# Patient Record
Sex: Male | Born: 1970 | Race: White | Hispanic: No | Marital: Married | Smoking: Never smoker
Health system: Southern US, Community
[De-identification: ages and names within clinical notes are randomized; demographics above are authoritative.]

## PROBLEM LIST (undated history)

## (undated) DIAGNOSIS — I77810 Thoracic aortic ectasia: Secondary | ICD-10-CM

## (undated) DIAGNOSIS — F419 Anxiety disorder, unspecified: Secondary | ICD-10-CM

## (undated) HISTORY — DX: Thoracic aortic ectasia: I77.810

## (undated) HISTORY — DX: Anxiety disorder, unspecified: F41.9

---
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
## 2001-06-28 ENCOUNTER — Ambulatory Visit (HOSPITAL_COMMUNITY): Admission: RE | Admit: 2001-06-28 | Discharge: 2001-06-28 | Payer: Self-pay | Admitting: Family Medicine

## 2001-06-28 ENCOUNTER — Encounter: Payer: Self-pay | Admitting: Family Medicine

## 2003-04-10 ENCOUNTER — Emergency Department (HOSPITAL_COMMUNITY): Admission: EM | Admit: 2003-04-10 | Discharge: 2003-04-11 | Payer: Self-pay

## 2004-07-07 ENCOUNTER — Emergency Department (HOSPITAL_COMMUNITY): Admission: EM | Admit: 2004-07-07 | Discharge: 2004-07-07 | Payer: Self-pay | Admitting: Emergency Medicine

## 2004-08-14 ENCOUNTER — Ambulatory Visit: Admission: RE | Admit: 2004-08-14 | Discharge: 2004-08-14 | Payer: Self-pay | Admitting: *Deleted

## 2004-08-14 ENCOUNTER — Encounter (INDEPENDENT_AMBULATORY_CARE_PROVIDER_SITE_OTHER): Payer: Self-pay | Admitting: *Deleted

## 2004-11-11 ENCOUNTER — Emergency Department (HOSPITAL_COMMUNITY): Admission: EM | Admit: 2004-11-11 | Discharge: 2004-11-11 | Payer: Self-pay | Admitting: Emergency Medicine

## 2004-11-14 ENCOUNTER — Ambulatory Visit: Payer: Self-pay | Admitting: Gastroenterology

## 2004-11-19 ENCOUNTER — Ambulatory Visit: Payer: Self-pay | Admitting: Gastroenterology

## 2004-11-21 ENCOUNTER — Ambulatory Visit: Payer: Self-pay | Admitting: Gastroenterology

## 2004-11-21 DIAGNOSIS — K209 Esophagitis, unspecified without bleeding: Secondary | ICD-10-CM | POA: Insufficient documentation

## 2004-11-21 DIAGNOSIS — K449 Diaphragmatic hernia without obstruction or gangrene: Secondary | ICD-10-CM | POA: Insufficient documentation

## 2004-11-21 DIAGNOSIS — K219 Gastro-esophageal reflux disease without esophagitis: Secondary | ICD-10-CM | POA: Insufficient documentation

## 2004-12-18 ENCOUNTER — Ambulatory Visit: Payer: Self-pay | Admitting: Gastroenterology

## 2004-12-30 ENCOUNTER — Emergency Department (HOSPITAL_COMMUNITY): Admission: EM | Admit: 2004-12-30 | Discharge: 2004-12-30 | Payer: Self-pay | Admitting: Emergency Medicine

## 2005-01-01 ENCOUNTER — Emergency Department (HOSPITAL_COMMUNITY): Admission: EM | Admit: 2005-01-01 | Discharge: 2005-01-01 | Payer: Self-pay | Admitting: Emergency Medicine

## 2005-03-09 ENCOUNTER — Emergency Department (HOSPITAL_COMMUNITY): Admission: EM | Admit: 2005-03-09 | Discharge: 2005-03-09 | Payer: Self-pay | Admitting: Emergency Medicine

## 2005-04-03 ENCOUNTER — Ambulatory Visit: Payer: Self-pay | Admitting: Gastroenterology

## 2005-05-05 ENCOUNTER — Ambulatory Visit: Payer: Self-pay | Admitting: Gastroenterology

## 2005-07-27 ENCOUNTER — Ambulatory Visit: Payer: Self-pay | Admitting: Gastroenterology

## 2005-08-04 ENCOUNTER — Ambulatory Visit: Payer: Self-pay | Admitting: Gastroenterology

## 2006-08-03 ENCOUNTER — Ambulatory Visit: Payer: Self-pay | Admitting: Gastroenterology

## 2006-08-03 LAB — CONVERTED CEMR LAB
ALT: 17 units/L (ref 0–53)
AST: 23 units/L (ref 0–37)
Albumin: 4.1 g/dL (ref 3.5–5.2)
Alkaline Phosphatase: 38 units/L — ABNORMAL LOW (ref 39–117)
BUN: 19 mg/dL (ref 6–23)
Basophils Absolute: 0 10*3/uL (ref 0.0–0.1)
Basophils Relative: 0.1 % (ref 0.0–1.0)
Bilirubin, Direct: 0.2 mg/dL (ref 0.0–0.3)
CO2: 30 meq/L (ref 19–32)
Calcium: 9.6 mg/dL (ref 8.4–10.5)
Chloride: 100 meq/L (ref 96–112)
Creatinine, Ser: 1 mg/dL (ref 0.4–1.5)
Eosinophils Absolute: 0.1 10*3/uL (ref 0.0–0.6)
Eosinophils Relative: 1.9 % (ref 0.0–5.0)
Folate: 16.5 ng/mL
GFR calc Af Amer: 109 mL/min
GFR calc non Af Amer: 90 mL/min
Glucose, Bld: 102 mg/dL — ABNORMAL HIGH (ref 70–99)
HCT: 43.7 % (ref 39.0–52.0)
Hemoglobin: 15.5 g/dL (ref 13.0–17.0)
Lymphocytes Relative: 22.3 % (ref 12.0–46.0)
MCHC: 35.5 g/dL (ref 30.0–36.0)
MCV: 88.8 fL (ref 78.0–100.0)
Monocytes Absolute: 0.3 10*3/uL (ref 0.2–0.7)
Monocytes Relative: 6.3 % (ref 3.0–11.0)
Neutro Abs: 3.6 10*3/uL (ref 1.4–7.7)
Neutrophils Relative %: 69.4 % (ref 43.0–77.0)
Platelets: 210 10*3/uL (ref 150–400)
Potassium: 3.5 meq/L (ref 3.5–5.1)
RBC: 4.92 M/uL (ref 4.22–5.81)
RDW: 11.8 % (ref 11.5–14.6)
Sed Rate: 5 mm/hr (ref 0–20)
Sodium: 134 meq/L — ABNORMAL LOW (ref 135–145)
TSH: 0.93 microintl units/mL (ref 0.35–5.50)
Total Bilirubin: 1.3 mg/dL — ABNORMAL HIGH (ref 0.3–1.2)
Total Protein: 7.1 g/dL (ref 6.0–8.3)
Vitamin B-12: 636 pg/mL (ref 211–911)
WBC: 5.2 10*3/uL (ref 4.5–10.5)

## 2006-11-24 ENCOUNTER — Ambulatory Visit: Payer: Self-pay | Admitting: Gastroenterology

## 2007-03-03 DIAGNOSIS — F411 Generalized anxiety disorder: Secondary | ICD-10-CM | POA: Insufficient documentation

## 2007-03-03 DIAGNOSIS — F410 Panic disorder [episodic paroxysmal anxiety] without agoraphobia: Secondary | ICD-10-CM

## 2007-03-03 DIAGNOSIS — K589 Irritable bowel syndrome without diarrhea: Secondary | ICD-10-CM

## 2009-11-06 ENCOUNTER — Emergency Department (HOSPITAL_COMMUNITY): Admission: EM | Admit: 2009-11-06 | Discharge: 2009-11-06 | Payer: Self-pay | Admitting: Emergency Medicine

## 2010-05-27 NOTE — Assessment & Plan Note (Signed)
[facility]                         GASTROENTEROLOGY OFFICE NOTE   NAME:[name], Tyrone                       MRN:          [id number]  DATE:11/24/2006                            DOB:          Mar 31, 1970    Tyrone [name] was tapered off of Klonopin but has had a relapse of his chronic  anxiety syndrome.  Once this occurs he has shortness of breath,  palpitations, and irritable bowel syndrome-type problems.  He has not  abused his medications, is well aware of possible addiction abuse but  certainly this does not appear to be a factor in his management.  I have  asked him to speak to Dr. Dellia Cloud about his chronic anxiety syndrome.  I have gone ahead and re-upped his Klonopin to 1 mg twice a day with  followup in 1 month's time.  As per my previous notes, this management  of his chronic anxiety syndrome seems to manage most of his medical  problems.     Vania Rea. Jarold Motto, MD, Caleen Essex, FAGA  Electronically Signed    DRP/MedQ  DD: 11/24/2006  DT: 11/25/2006  Job #: [id number]   cc:   Onalee Hua L. Dellia Cloud, PhD

## 2010-05-27 NOTE — Assessment & Plan Note (Signed)
[facility]                         GASTROENTEROLOGY OFFICE NOTE   NAME:Tyrone [name], Saiki                       MRN:          [id number]  DATE:08/03/2006                            DOB:          12-16-1970    Red is doing well and has come off of his Zantac.  He never was  treated as previously recommended July of 2007 for Giardia because he  said it was too expensive.  In any case, he denies any GI complaints  whatsoever except for inability to gain weight.  He is having no  diarrhea, melena, hematochezia, or any upper or lower GI complaints.  He  has a history of IBS with a history of anxiety attacks.  He has been  taking Klonopin 1 mg twice a day for at least a year.  He continues to  work out and hike and is very active.  Previous evaluation for  malabsorption and celiac disease was negative.   He is a healthy-appearing white male in no distress, appearing his  stated age.  He weight 171 pounds, which is up 1 pound from a year ago.  Blood  pressure 112/48, the pulse was 80 and regular.  I could not appreciate stigmata of chronic liver disease.  His chest was clear and he was in a regular rhythm without murmurs,  gallops, or rubs.  His abdomen was entirely benign without organomegaly, masses,  tenderness, or distension.  Bowel sounds were normal.  RECTAL EXAM:  Deferred.  Peripheral extremities were unremarkable.  Mental status was clear.   ASSESSMENT:  Tyrone [name] is doing well and has irritable bowel syndrome and  history of panic attacks and chronic anxiety syndrome.  I sincerely  doubt that he has any chronic malabsorption state.   RECOMMENDATIONS:  1. Repeat screening lab parameters and repeat sprue antibodies.  2. We will taper him off of Klonopin at a rate of 0.5 mg per day every      2 weeks as tolerated.  3. Otherwise p.r.n. GI followup as needed.     Vania Rea. Jarold Motto, MD, Caleen Essex, FAGA  Electronically Signed    DRP/MedQ   DD: 08/03/2006  DT: 08/03/2006  Job #: [id number]

## 2010-05-27 NOTE — Assessment & Plan Note (Signed)
[facility]                                 ON-CALL NOTE   NAME:[name], [name]                       MRN:          [id number]  DATE:11/22/2006                            DOB:          1970/02/11    TIME:  12 p.m.   TELEPHONE:  [id number].   GASTROENTEROLOGIST:  Vania Rea. Jarold Motto, MD, Clementeen Graham, FACP, FAGA.   [name] called this evening wanting advice on resuming Klonopin.  He  states that Dr. Sheryn Bison started Klonopin previously and he has  tapered off the medication under Dr. Norval Gable supervision.  He has a  return in anxiety and he would like advice on restarting the medication.  His symptoms have been present for several days.  He has no acute  problem today but just would like long term advice on the use of this  medication.  I advised him I was not able to adequately address his  question since I am unsure of his full medical history and the reason  this medication was used initially and how it was tapered.  I advised  him to call Dr. Jarold Motto during our normal office hours, 8 a.m. to 5  p.m., tomorrow for further advice and he states he will do so.     Venita Lick. Russella Dar, MD, [facility]  Electronically Signed    MTS/MedQ  DD: 11/22/2006  DT: 11/22/2006  Job #: [id number]   cc:   Vania Rea. Jarold Motto, MD, Caleen Essex, FAGA

## 2010-05-30 NOTE — Assessment & Plan Note (Signed)
[name]                           GASTROENTEROLOGY OFFICE NOTE   NAME:PERKINSLeven, [name]                       MRN:          [id number]  DATE:08/04/2005                            DOB:          04-11-1970    HISTORY OF PRESENT ILLNESS:  Tyrone [name] is doing better on Klonopin at a low  dose and Zantac. He continues with gas, bloating, and bowel irregularity and  gives a very strong history of exposure to Giardia with back packing. He has  a friend, who was recently diagnosed as having Giardiasis. We did 2 stool  examinations on July 29, 2005 which were negative for Giardia, however.   PHYSICAL EXAMINATION:  VITAL SIGNS:  He continues to be unable to gain  weight despite the fact his weight today was up to 171 pounds from a low of  168. Blood pressure 118/84, and pulse was 72 and regular.  GENERAL:  Physical examination was not repeated.   ASSESSMENT/PLAN:  I suspect Tyrone [name] does have irritable bowel syndrome.  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 I  have empirically decided to put him on metronidazole 250 mg t.i.d. for 10  days, along with Align probiotic therapy. He is to call in 2 weeks time for  progress report. Previous celiac antibodies were negative but I cannot see  where he has had a small bowel biopsy.                                   Vania Rea. Jarold Motto, MD, Clementeen Graham, [place]   DRP/MedQ  DD:  08/04/2005  DT:  08/04/2005  Job #:  [id number]

## 2016-03-08 ENCOUNTER — Emergency Department (HOSPITAL_COMMUNITY)
Admission: EM | Admit: 2016-03-08 | Discharge: 2016-03-08 | Disposition: A | Discharge status: Home / Self Care | Payer: Self-pay | Attending: Emergency Medicine | Admitting: Emergency Medicine

## 2016-03-08 ENCOUNTER — Encounter (HOSPITAL_COMMUNITY): Payer: Self-pay | Admitting: Emergency Medicine

## 2016-03-08 DIAGNOSIS — K0889 Other specified disorders of teeth and supporting structures: Secondary | ICD-10-CM | POA: Insufficient documentation

## 2016-03-08 MED ORDER — AMOXICILLIN 500 MG PO CAPS: 500.0000 mg | ORAL_CAPSULE | Freq: Three times a day (TID) | ORAL | 0 refills | Status: DC

## 2016-03-08 MED ORDER — HYDROCODONE-ACETAMINOPHEN 5-325 MG PO TABS
2.0000 | ORAL_TABLET | ORAL | 0 refills | Status: DC | PRN
Start: 2016-03-08 — End: 2018-11-28

## 2016-03-08 NOTE — ED Notes (Signed)
Declined W/C at D/C and was escorted to lobby by RN. 

## 2016-03-08 NOTE — ED Provider Notes (Signed)
[facility]-EMERGENCY DEPT Provider Note   CSN: [id number] Arrival date & time: 03/08/16  0750     History   Chief Complaint Chief Complaint  Patient presents with  . Dental Pain    HPI Tyrone [name] is a 46 y.o. male.  The history is provided by the patient.  Dental Pain   This is a new problem. The current episode started more than 1 week ago. The problem occurs constantly. The problem has been gradually worsening. The pain is severe. He has tried nothing for the symptoms. The treatment provided no relief.  Pt complains of bad dental pain.   Pt reports he has not seen a dentist in a long time. Pt reports pain is severe.  Pt states he can not wait another day.  Pt reports [facility] and they agreed to see him if he came here.   History reviewed. No pertinent past medical history.  Patient Active Problem List   Diagnosis Date Noted  . ANXIETY, CHRONIC 03/03/2007  . PANIC ATTACK 03/03/2007  . IRRITABLE BOWEL SYNDROME 03/03/2007  . ESOPHAGITIS 11/21/2004  . ESOPHAGEAL REFLUX 11/21/2004  . HIATAL HERNIA 11/21/2004    History reviewed. No pertinent surgical history.     Home Medications    Prior to Admission medications   Medication Sig Start Date End Date Taking? Authorizing Provider  amoxicillin (AMOXIL) 500 MG capsule Take 1 capsule (500 mg total) by mouth 3 (three) times daily. 03/08/16   Elson AreasLeslie K Lopaka Karge, PA-C  HYDROcodone-acetaminophen (NORCO/VICODIN) 5-325 MG tablet Take 2 tablets by mouth every 4 (four) hours as needed. 03/08/16   Elson AreasLeslie K Kiaan Overholser, PA-C    Family History History reviewed. No pertinent family history.  Social History Social History  Substance Use Topics  . Smoking status: Never Smoker  . Smokeless tobacco: Never Used  . Alcohol use No     Allergies   Patient has no known allergies.   Review of Systems Review of Systems  All other systems reviewed and are negative.    Physical Exam Updated Vital Signs BP 140/87 (BP Location:  Left Arm)   Pulse 66   Temp 97.7 F (36.5 C) (Oral)   Resp 16   SpO2 100%   Physical Exam  Constitutional: He appears well-developed and well-nourished.  HENT:  Head: Normocephalic and atraumatic.  Mouth/Throat: Oropharynx is clear and moist.  Eyes: Conjunctivae are normal.  Neck: Neck supple.  Cardiovascular: Normal rate and regular rhythm.   No murmur heard. Pulmonary/Chest: Effort normal and breath sounds normal. No respiratory distress.  Abdominal: Soft. There is no tenderness.  Musculoskeletal: He exhibits no edema.  Neurological: He is alert.  Skin: Skin is warm and dry.  Psychiatric: He has a normal mood and affect.  Nursing note and vitals reviewed.  No obvious abscess, cavity in 2nd molar.  Pt requested I call [facility].  I attempted x 2.  They did not return my call.  I gave pt the number for Dr. Leanord Asalfarless and advised that he call tomorrow.   ED Treatments / Results  Labs (all labs ordered are listed, but only abnormal results are displayed) Labs Reviewed - No data to display  EKG  EKG Interpretation None       Radiology No results found.  Procedures Procedures (including critical care time)  Medications Ordered in ED Medications - No data to display   Initial Impression / Assessment and Plan / ED Course  I have reviewed the triage vital signs and the  nursing notes.  Pertinent labs & imaging results that were available during my care of the patient were reviewed by me and considered in my medical decision making (see chart for details).     Meds ordered this encounter  Medications  . amoxicillin (AMOXIL) 500 MG capsule    Sig: Take 1 capsule (500 mg total) by mouth 3 (three) times daily.    Dispense:  30 capsule    Refill:  0    Order Specific Question:   Supervising Provider    Answer:   MILLER, BRIAN [[id number]]  . HYDROcodone-acetaminophen (NORCO/VICODIN) 5-325 MG tablet    Sig: Take 2 tablets by mouth every 4 (four) hours as needed.     Dispense:  10 tablet    Refill:  0    Order Specific Question:   Supervising Provider    Answer:   Eber Hong [[id number]]  An After Visit Summary was printed and given to the patient.    Final Clinical Impressions(s) / ED Diagnoses   Final diagnoses:  Toothache    New Prescriptions Discharge Medication List as of 03/08/2016  8:56 AM    START taking these medications   Details  amoxicillin (AMOXIL) 500 MG capsule Take 1 capsule (500 mg total) by mouth 3 (three) times daily., Starting Sun 03/08/2016, Print    HYDROcodone-acetaminophen (NORCO/VICODIN) 5-325 MG tablet Take 2 tablets by mouth every 4 (four) hours as needed., Starting Sun 03/08/2016, Print         Lonia Skinner [place], PA-C 03/08/16 1049    Canary Brim Tegeler, MD 03/08/16 2049

## 2016-03-08 NOTE — ED Triage Notes (Signed)
Pt here with left sided dental pain 

## 2016-03-08 NOTE — ED Triage Notes (Signed)
PT reports dental pain going on for some time but would not be specific on start date. Pain was worse last night and Pt reports he could not sleep. Pt reports he has not been to a dentist but has been using listerine mouth wash for pain.

## 2017-10-28 ENCOUNTER — Other Ambulatory Visit: Payer: Self-pay | Admitting: Cardiology

## 2017-10-28 DIAGNOSIS — R9439 Abnormal result of other cardiovascular function study: Secondary | ICD-10-CM

## 2017-10-28 DIAGNOSIS — I493 Ventricular premature depolarization: Secondary | ICD-10-CM

## 2017-10-28 DIAGNOSIS — R079 Chest pain, unspecified: Secondary | ICD-10-CM

## 2017-11-15 ENCOUNTER — Ambulatory Visit (HOSPITAL_COMMUNITY)
Admission: RE | Admit: 2017-11-15 | Discharge: 2017-11-15 | Disposition: A | Discharge status: Home / Self Care | Payer: Self-pay | Source: Ambulatory Visit | Attending: Cardiology | Admitting: Cardiology

## 2017-11-15 DIAGNOSIS — R079 Chest pain, unspecified: Secondary | ICD-10-CM

## 2017-11-15 DIAGNOSIS — I719 Aortic aneurysm of unspecified site, without rupture: Secondary | ICD-10-CM | POA: Insufficient documentation

## 2017-11-15 DIAGNOSIS — R9439 Abnormal result of other cardiovascular function study: Secondary | ICD-10-CM | POA: Insufficient documentation

## 2017-11-15 DIAGNOSIS — I493 Ventricular premature depolarization: Secondary | ICD-10-CM | POA: Insufficient documentation

## 2017-11-15 MED ORDER — NITROGLYCERIN 0.4 MG SL SUBL
0.8000 mg | SUBLINGUAL_TABLET | Freq: Once | SUBLINGUAL | Status: AC
  Administered 2017-11-15: 0.8 mg via SUBLINGUAL

## 2017-11-15 MED ORDER — IOPAMIDOL (ISOVUE-370) INJECTION 76%
100.0000 mL | Freq: Once | INTRAVENOUS | Status: AC | PRN
  Administered 2017-11-15: 100 mL via INTRAVENOUS

## 2017-11-15 MED ORDER — METOPROLOL TARTRATE 5 MG/5ML IV SOLN
INTRAVENOUS | Status: AC
  Filled 2017-11-15: qty 20

## 2017-11-15 MED ORDER — NITROGLYCERIN 0.4 MG SL SUBL
SUBLINGUAL_TABLET | SUBLINGUAL | Status: AC
  Filled 2017-11-15: qty 2

## 2017-11-15 MED ORDER — METOPROLOL TARTRATE 5 MG/5ML IV SOLN
10.0000 mg | INTRAVENOUS | Status: DC | PRN
  Administered 2017-11-15: 10 mg via INTRAVENOUS
  Filled 2017-11-15: qty 10

## 2018-02-21 ENCOUNTER — Telehealth: Payer: Self-pay | Admitting: Cardiology

## 2018-02-21 ENCOUNTER — Other Ambulatory Visit: Payer: Self-pay | Admitting: Cardiology

## 2018-02-21 ENCOUNTER — Other Ambulatory Visit: Payer: Self-pay

## 2018-02-21 MED ORDER — LOSARTAN POTASSIUM 50 MG PO TABS: 50.0000 mg | ORAL_TABLET | Freq: Every day | ORAL | 3 refills | Status: DC

## 2018-02-22 LAB — BASIC METABOLIC PANEL
BUN/Creatinine Ratio: 13 (ref 9–20)
BUN: 14 mg/dL (ref 6–24)
CALCIUM: 9.4 mg/dL (ref 8.7–10.2)
CHLORIDE: 101 mmol/L (ref 96–106)
CO2: 27 mmol/L (ref 20–29)
Creatinine, Ser: 1.11 mg/dL (ref 0.76–1.27)
GFR calc non Af Amer: 79 mL/min/{1.73_m2} (ref >59)
GFR, EST AFRICAN AMERICAN: 91 mL/min/{1.73_m2} (ref >59)
GLUCOSE: 93 mg/dL (ref 65–99)
POTASSIUM: 3.8 mmol/L (ref 3.5–5.2)
Sodium: 140 mmol/L (ref 134–144)

## 2018-03-01 NOTE — Telephone Encounter (Signed)
-----   Message from Toniann Fail, NP sent at 02/26/2018 11:13 AM EST ----- Kidney function and electrolytes are all within normal limits

## 2018-03-01 NOTE — Telephone Encounter (Signed)
Pt aware.

## 2018-11-23 ENCOUNTER — Other Ambulatory Visit: Payer: Self-pay | Admitting: Cardiology

## 2018-11-28 ENCOUNTER — Encounter: Payer: Self-pay | Admitting: Cardiology

## 2018-11-28 ENCOUNTER — Ambulatory Visit: Payer: Self-pay | Admitting: Cardiology

## 2018-11-28 ENCOUNTER — Other Ambulatory Visit: Payer: Self-pay

## 2018-11-28 VITALS — BP 118/79 | HR 67 | Temp 98.3°F | Ht 76.0 in | Wt 196.5 lb

## 2018-11-28 DIAGNOSIS — I493 Ventricular premature depolarization: Secondary | ICD-10-CM

## 2018-11-28 DIAGNOSIS — I77810 Thoracic aortic ectasia: Secondary | ICD-10-CM

## 2018-11-28 DIAGNOSIS — F419 Anxiety disorder, unspecified: Secondary | ICD-10-CM | POA: Insufficient documentation

## 2018-11-28 DIAGNOSIS — E785 Hyperlipidemia, unspecified: Secondary | ICD-10-CM

## 2018-11-28 DIAGNOSIS — O161 Unspecified maternal hypertension, first trimester: Secondary | ICD-10-CM | POA: Insufficient documentation

## 2018-11-28 MED ORDER — LOSARTAN POTASSIUM 50 MG PO TABS: 50.0000 mg | ORAL_TABLET | Freq: Every day | ORAL | 3 refills | Status: DC

## 2018-11-28 NOTE — Progress Notes (Signed)
Primary Physician/Referring:  System, Pcp Not In  Patient ID: Tyrone [name], male    DOB: 1970-07-13, 48 y.o.   MRN: [id number]  Chief Complaint  Patient presents with  . pvc   HPI:    Tyrone [name]  is a 48 y.o. Caucasian male  active male with no significant PMH except for near syncope many years ago and probable sinus tachycardia or SVT and PVC on EKG underwent treadmill stress test which revealed ST changes suggestive of ischemia, but excellent exercise tolerance and recommended coronary CTA. Echocardiogram was essentially unremarkable, test were done in September 2019.  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  He now presents for follow-up.   Except for occasional palpitations remains asymptomatic.  He has not had any chest pain, dizziness or syncope.  Past Medical History:  Diagnosis Date  . Anxiety   . Aortic root dilatation (HCC)    History reviewed. No pertinent surgical history. Social History   Socioeconomic History  . Marital status: Married    Spouse name: Not on file  . Number of children: 3  . Years of education: Not on file  . Highest education level: Not on file  Occupational History  . Not on file  Social Needs  . Financial resource strain: Not on file  . Food insecurity    Worry: Not on file    Inability: Not on file  . Transportation needs    Medical: Not on file    Non-medical: Not on file  Tobacco Use  . Smoking status: Never Smoker  . Smokeless tobacco: Never Used  Substance and Sexual Activity  . Alcohol use: No  . Drug use: No  . Sexual activity: Not on file  Lifestyle  . Physical activity    Days per week: Not on file    Minutes per session: Not on file  . Stress: Not on file  Relationships  . Social Herbalist on phone: Not on file    Gets together: Not on file    Attends religious service: Not on file    Active member of club or  organization: Not on file    Attends meetings of clubs or organizations: Not on file    Relationship status: Not on file  . Intimate partner violence    Fear of current or ex partner: Not on file    Emotionally abused: Not on file    Physically abused: Not on file    Forced sexual activity: Not on file  Other Topics Concern  . Not on file  Social History Narrative  . Not on file   ROS  Review of Systems  Constitution: Negative for chills, decreased appetite, malaise/fatigue and weight gain.  Cardiovascular: Positive for palpitations (occasional). Negative for dyspnea on exertion, leg swelling and syncope.  Endocrine: Negative for cold intolerance.  Hematologic/Lymphatic: Does not bruise/bleed easily.  Musculoskeletal: Negative for joint swelling.  Gastrointestinal: Negative for abdominal pain, anorexia, change in bowel habit, hematochezia and melena.  Neurological: Negative for headaches and light-headedness.  Psychiatric/Behavioral: Negative for depression and substance abuse.  All other systems reviewed and are negative.  Objective   Vitals with BMI 11/28/2018 11/15/2017 11/15/2017  Height _0  - -  Weight 196 lbs 8 oz - -  BMI 54.65 - -  Systolic 035 465 681  Diastolic 79 67 78  Pulse 67 - -  Physical Exam  Constitutional: He appears well-developed and well-nourished.  HENT:  Head: Atraumatic.  Eyes: Conjunctivae are normal.  Neck: Neck supple. No JVD present. No thyromegaly present.  Cardiovascular: Normal rate, regular rhythm, normal heart sounds and intact distal pulses.  Occasional extrasystoles are present. Exam reveals no gallop.  No murmur heard. No leg edema, no JVD.  Pulmonary/Chest: Effort normal and breath sounds normal.  Abdominal: Soft. Bowel sounds are normal.  Musculoskeletal: Normal range of motion.  Neurological: He is alert.  Skin: Skin is warm and dry.  Psychiatric: He has a normal mood and affect.   Laboratory examination:    Recent  Labs    02/21/18 0915  NA 140  K 3.8  CL 101  CO2 27  GLUCOSE 93  BUN 14  CREATININE 1.11  CALCIUM 9.4  GFRNONAA 79  GFRAA 91   CrCl cannot be calculated (Patient's most recent lab result is older than the maximum 21 days allowed.).  CMP Latest Ref Rng & Units 02/21/2018 08/03/2006  Glucose 65 - 99 mg/dL 93 102(H)  BUN 6 - 24 mg/dL 14 19  Creatinine 0.76 - 1.27 mg/dL 1.11 1.0  Sodium 134 - 144 mmol/L 140 134(L)  Potassium 3.5 - 5.2 mmol/L 3.8 3.5  Chloride 96 - 106 mmol/L 101 100  CO2 20 - 29 mmol/L 27 30  Calcium 8.7 - 10.2 mg/dL 9.4 9.6  Total Protein 6.0 - 8.3 g/dL - 7.1  Total Bilirubin 0.3 - 1.2 mg/dL - 1.3(H)  Alkaline Phos 39 - 117 units/L - 38(L)  AST 0 - 37 units/L - 23  ALT 0 - 53 units/L - 17   CBC Latest Ref Rng & Units 08/03/2006  WBC 4.5 - 10.5 10*3/microliter 5.2  Hemoglobin 13.0 - 17.0 g/dL 15.5  Hematocrit 39.0 - 52.0 % 43.7  Platelets 150 - 400 K/uL 210   Lipid Panel  09/30/2017: Creatinine 1.15, EGFR 75/87, potassium 3.8, CMP normal. CBC normal. Cholesterol 186, triglycerides 73, HDL 52, LDL 119. TSH 1.3.  No results found for: CHOL, TRIG, HDL, CHOLHDL, VLDL, LDLCALC, LDLDIRECT HEMOGLOBIN A1C No results found for: HGBA1C, MPG TSH No results for input(s): TSH in the last 8760 hours. Medications and allergies  No Known Allergies   Current Outpatient Medications  Medication Instructions  . clonazePAM (KLONOPIN) 0.5 mg, Oral, Daily  . Coenzyme Q10 (CO Q 10 PO) Oral, Daily  . losartan (COZAAR) 50 mg, Oral, Daily    Radiology:  No results found. Cardiac Studies:   Echocardiogram 10/11/2017: Left ventricle cavity is normal in size. Mild concentric hypertrophy of the left ventricle. Normal global wall motion. Normal diastolic filling pattern. Calculated EF 56%. No significant valvular abnormality.IVC is dilated with respiratory variation. This is likely normal in a young individual.  Treadmill exercise stress test 10/11/2017 Indication: SOB,  Abnormal EKG, palpitations The patient exercised on Bruce protocol for 10:57 min. Patient achieved 13.36 METS and reached HR 160 bpm, which is 92 % of maximum age-predicted HR. Stress test terminated due to fatigue. Resting EKG demonstrates Normal sinus rhythm. Occasional PVC at rest which resolved with exercise and reappeared in recovery. No complex arrhythmias. ST Changes: With peak exercise there was down-sloping ST depression of 2 mm which persisted for >2 minutes into recovery. Chest Pain: none. Stress test terminated due to fatigue. BP Response to Exercise: Normal resting BP- appropriate response. HR Response to Exercise: Appropriate. Exercise capacity was normal.  Recommendations: Consider further cardiac evaluation including coronary CTA, Echocardiogram if clinically indicated.  Coronary  CTA angiogram 11/15/2017: 1. Calcium score 0 2.  Normal right dominant coronary arteries with no anomalies 3. Mild aortic root dilatation 4.0 cm compared to descending thoracic aorta 2.8 cm Recommend annual screening.  Assessment     ICD-10-CM   1. Aortic root dilatation (HCC)  I77.810 EKG 12-Lead    losartan (COZAAR) 50 MG tablet    CT CHEST W CONTRAST  2. PVC (premature ventricular contraction)  B01.7 Basic Metabolic Panel (BMET)  3. Mild hyperlipidemia  E78.5 Lipid Panel With LDL/HDL Ratio    EKG 11/28/2018: Normal sinus rhythm at rate of 63 bpm, normal axis.  No evidence of ischemia.  Single PVC.  Recommendations:   Meds ordered this encounter  Medications  . losartan (COZAAR) 50 MG tablet    Sig: Take 1 tablet (50 mg total) by mouth daily.    Dispense:  90 tablet    Refill:  3    Tyrone [name]  is a 48 y.o. Caucasian male  active male with PVC on EKG, due to abnormal stress EKG in September 2019, underwent coronary CTA  who has a  aortic root dilatation at 4 cm, recommended annual screening. He is tolerating Losartan for this. Has occasional palpitations.    Patient is here on  annual visit, remains asymptomatic except for occasional palpitations, EKG does reveal single PVC.  He needs repeat CT scan of the chest to evaluate aortic root dilatation. He has mild hyperlpidemia. I will also check lipids. I will see him back in one year.  Adrian Prows, MD, [facility] 11/28/2018, 3:43 PM [facility] [place] Pager: [phone] Office: [phone] If no answer Cell [phone]

## 2018-12-05 ENCOUNTER — Encounter: Payer: Self-pay | Admitting: Radiology

## 2018-12-07 ENCOUNTER — Ambulatory Visit
Admission: RE | Admit: 2018-12-07 | Discharge: 2018-12-07 | Disposition: A | Discharge status: Home / Self Care | Payer: Self-pay | Source: Ambulatory Visit | Attending: Cardiology | Admitting: Cardiology

## 2018-12-07 DIAGNOSIS — I77810 Thoracic aortic ectasia: Secondary | ICD-10-CM

## 2018-12-07 MED ORDER — IOPAMIDOL (ISOVUE-370) INJECTION 76%
75.0000 mL | Freq: Once | INTRAVENOUS | Status: AC | PRN
  Administered 2018-12-07: 10:00:00 75 mL via INTRAVENOUS

## 2018-12-14 ENCOUNTER — Telehealth: Payer: Self-pay

## 2018-12-14 ENCOUNTER — Other Ambulatory Visit: Payer: Self-pay | Admitting: Cardiology

## 2018-12-14 NOTE — Telephone Encounter (Signed)
Lets monitor for now and see how he does, can discontinue Losartan. I have taken it out of his med list

## 2018-12-14 NOTE — Telephone Encounter (Signed)
Pt called and wanted to know if he should continue the Losartan, he would prefer to not take it. Marland Kitchen

## 2018-12-15 NOTE — Telephone Encounter (Signed)
It was for aortic root dilatation

## 2018-12-15 NOTE — Telephone Encounter (Signed)
Pt wanted to know why he was on Losartan was it for BP?

## 2019-11-27 ENCOUNTER — Encounter: Payer: Self-pay | Admitting: Cardiology

## 2019-11-27 ENCOUNTER — Ambulatory Visit: Payer: Self-pay | Admitting: Cardiology

## 2019-11-27 ENCOUNTER — Other Ambulatory Visit: Payer: Self-pay

## 2019-11-27 VITALS — BP 123/84 | HR 76 | Resp 16 | Ht 76.0 in | Wt 199.0 lb

## 2019-11-27 DIAGNOSIS — I493 Ventricular premature depolarization: Secondary | ICD-10-CM

## 2019-11-27 DIAGNOSIS — I77810 Thoracic aortic ectasia: Secondary | ICD-10-CM

## 2019-11-27 DIAGNOSIS — E785 Hyperlipidemia, unspecified: Secondary | ICD-10-CM

## 2019-11-27 NOTE — Progress Notes (Signed)
Primary Physician/Referring:  Patient, No Pcp Per  Patient ID: SHARON STAPEL, male    DOB: 1970-06-17, 49 y.o.   MRN: [id number]  Chief Complaint  Patient presents with  . Aortic root dilatation  . Follow-up    1 year   HPI:    KENDELL SAGRAVES  is a 49 y.o. Caucasian male  active male with no significant PMH except for near syncope many years ago and probable sinus tachycardia or SVT and PVC on EKG underwent treadmill stress test which revealed ST changes suggestive of ischemia, but excellent exercise tolerance and recommended coronary CTA. Echocardiogram was essentially unremarkable, test were done in September 2019.  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    Presents for annual follow up.  Patient is presently doing well and remains asymptomatic with the exception of occasional palpitations more noticeable when he lays down to sleep at night.  He reports he stays active taking care of his 3 young children and doing chores around the house, however he does not have a formal exercise routine.  He reports he eats relatively healthy and avoids caffeine and sugar.  He has gained weight since his last visit 1 year ago.  Denies chest pain, dizziness, syncope, near syncope.  He does not follow with a PCP.   Past Medical History:  Diagnosis Date  . Anxiety   . Aortic root dilatation (HCC)    History reviewed. No pertinent surgical history.  Social History   Tobacco Use  . Smoking status: Never Smoker  . Smokeless tobacco: Never Used  Substance Use Topics  . Alcohol use: No  Marital Status: Married  ROS  Review of Systems  Constitutional: Negative for chills, decreased appetite, malaise/fatigue and weight gain.  Cardiovascular: Positive for palpitations (occasional). Negative for chest pain, claudication, dyspnea on exertion, leg swelling, near-syncope, orthopnea, paroxysmal nocturnal dyspnea and  syncope.  Respiratory: Negative for shortness of breath.   Endocrine: Negative for cold intolerance.  Hematologic/Lymphatic: Does not bruise/bleed easily.  Musculoskeletal: Negative for joint swelling.  Gastrointestinal: Negative for abdominal pain, anorexia, change in bowel habit, hematochezia and melena.  Neurological: Negative for dizziness, headaches, light-headedness and weakness.  Psychiatric/Behavioral: Negative for depression and substance abuse.  All other systems reviewed and are negative.  Objective   Vitals with BMI 11/27/2019 11/28/2018 11/15/2017  Height 6' 4"  6' 4"  -  Weight 199 lbs 196 lbs 8 oz -  BMI 11.21 62.44 -  Systolic 695 072 257  Diastolic 84 79 67  Pulse 76 67 -      Physical Exam Vitals reviewed.  Constitutional:      Appearance: He is well-developed.  HENT:     Head: Normocephalic and atraumatic.  Eyes:     Conjunctiva/sclera: Conjunctivae normal.  Neck:     Thyroid: No thyromegaly.     Vascular: No JVD.  Cardiovascular:     Rate and Rhythm: Normal rate and regular rhythm. Frequent extrasystoles are present.    Pulses: Intact distal pulses.     Heart sounds: Normal heart sounds, S1 normal and S2 normal. No murmur heard.  No gallop.      Comments: No leg edema, no JVD. Pulmonary:     Effort: Pulmonary effort is normal. No respiratory distress.     Breath sounds: Normal breath sounds. No wheezing, rhonchi or rales.  Abdominal:     General: Bowel sounds are normal.     Palpations:  Abdomen is soft.  Musculoskeletal:        General: Normal range of motion.     Cervical back: Neck supple.     Right lower leg: No edema.     Left lower leg: No edema.  Skin:    General: Skin is warm and dry.  Neurological:     Mental Status: He is alert.    Laboratory examination:    No results for input(s): NA, K, CL, CO2, GLUCOSE, BUN, CREATININE, CALCIUM, GFRNONAA, GFRAA in the last 8760 hours. CrCl cannot be calculated (Patient's most recent lab result  is older than the maximum 21 days allowed.).  CMP Latest Ref Rng & Units 02/21/2018 08/03/2006  Glucose 65 - 99 mg/dL 93 102(H)  BUN 6 - 24 mg/dL 14 19  Creatinine 0.76 - 1.27 mg/dL 1.11 1.0  Sodium 134 - 144 mmol/L 140 134(L)  Potassium 3.5 - 5.2 mmol/L 3.8 3.5  Chloride 96 - 106 mmol/L 101 100  CO2 20 - 29 mmol/L 27 30  Calcium 8.7 - 10.2 mg/dL 9.4 9.6  Total Protein 6.0 - 8.3 g/dL - 7.1  Total Bilirubin 0.3 - 1.2 mg/dL - 1.3(H)  Alkaline Phos 39 - 117 units/L - 38(L)  AST 0 - 37 units/L - 23  ALT 0 - 53 units/L - 17   External Labs:   09/30/2017: Creatinine 1.15, EGFR 75/87, potassium 3.8, CMP normal. CBC normal. Cholesterol 186, triglycerides 73, HDL 52, LDL 119. TSH 1.3.   Medications and allergies  No Known Allergies   Current Outpatient Medications  Medication Instructions  . Coenzyme Q10 (CO Q 10 PO) Oral, Daily  . Omega-3 Fatty Acids (OMEGA 3 500 PO) 1 tablet, Oral, Daily    Radiology:  No results found.   Cardiac Studies:   Echocardiogram 10/11/2017: Left ventricle cavity is normal in size. Mild concentric hypertrophy of the left ventricle. Normal global wall motion. Normal diastolic filling pattern. Calculated EF 56%. No significant valvular abnormality.IVC is dilated with respiratory variation. This is likely normal in a young individual.  Treadmill exercise stress test 10/11/2017 Indication: SOB, Abnormal EKG, palpitations The patient exercised on Bruce protocol for 10:57 min. Patient achieved 13.36 METS and reached HR 160 bpm, which is 92 % of maximum age-predicted HR. Stress test terminated due to fatigue. Resting EKG demonstrates Normal sinus rhythm. Occasional PVC at rest which resolved with exercise and reappeared in recovery. No complex arrhythmias. ST Changes: With peak exercise there was down-sloping ST depression of 2 mm which persisted for >2 minutes into recovery. Chest Pain: none. Stress test terminated due to fatigue. BP Response to Exercise: Normal  resting BP- appropriate response. HR Response to Exercise: Appropriate. Exercise capacity was normal.  Recommendations: Consider further cardiac evaluation including coronary CTA, Echocardiogram if clinically indicated.  Coronary CTA angiogram 11/15/2017: 1. Calcium score 0 2.  Normal right dominant coronary arteries with no anomalies 3. Mild aortic root dilatation 4.0 cm compared to descending thoracic aorta 2.8 cm  Recommend annual screening.  CTA Chest Aorta 12/07/2018:  1. Ascending thoracic aortic diameter measures 3.9 x 3.9 cm. No evident thoracic aortic dissection. 2. Small hiatal hernia. 3. No demonstrable pulmonary embolus. 4. Lungs clear. 5. No evident thoracic adenopathy.  EKG   EKG 11/27/2019: Sinus rhythm at a rate of 80 bpm with frequent PVCs in a trigeminal pattern.  Left atrial enlargement.  Normal axis.  Poor R wave progression, cannot exclude anteroseptal infarct old.  Compared to EKG 11/28/2018, PVCs no frequent trigeminal pattern, LAE  new.   Assessment     ICD-10-CM   1. Aortic root dilatation (HCC)  I77.810 EKG 17-PZWC    Basic metabolic panel    CBC    PCV ECHOCARDIOGRAM COMPLETE    CT ANGIO CHEST AORTA W/CM & OR WO/CM  2. PVC (premature ventricular contraction)  I49.3 TSH    PCV ECHOCARDIOGRAM COMPLETE  3. Mild hyperlipidemia  E78.5 Lipid Panel With LDL/HDL Ratio    Basic metabolic panel    No orders of the defined types were placed in this encounter.  Medications Discontinued During This Encounter  Medication Reason  . clonazePAM (KLONOPIN) 0.5 MG tablet Patient Preference    Recommendations:   Tyrone [name]  is a 49 y.o. Caucasian male  active male with PVC on EKG, due to abnormal stress EKG in September 2019, underwent coronary CTA  who has a  aortic root dilatation at 4 cm, recommended annual screening.   Patient presents for annual follow-up of aortic root dilation and PVCs.  Reviewed and discussed CTA results from 12/07/2018, will repeat  CT angio to monitor aortic root dilation in approximately 1 year prior to follow-up visit as it has been stable.  We will also obtain repeat echocardiogram prior to next annual visit.  Patient does continue to have occasional palpitations, however these are not bothersome to him and he does not wish to take medications at this point.  Discussed with him regarding the option of medical management if this becomes bothersome to him.  I reviewed external labs however patient has not had recent blood work done.  Will obtain lipid profile testing, CBC, TSH, and BMP as patient does not follow with primary care and to evaluate potential underlying etiology of palpitations.  Blood pressure is well controlled. Encourage patient to continue to remain active with healthy diet and lifestyle. Of note he is no longer taking losartan, which I feel is appropriate at aortic dilation has been stable and question aneurysmal or just a normal variant.   Follow up in 1 year after repeat CTA and echocardiogram.   Patient was seen in collaboration with Dr. Einar Gip. He also reviewed patient's chart and examined the patient. Dr. Einar Gip is in agreement of the plan.    Alethia Berthold, PA-C 11/27/2019, 3:24 PM Office: [phone]

## 2019-12-09 LAB — CBC
Hematocrit: 46.8 % (ref 37.5–51.0)
Hemoglobin: 16.2 g/dL (ref 13.0–17.7)
MCH: 30.3 pg (ref 26.6–33.0)
MCHC: 34.6 g/dL (ref 31.5–35.7)
MCV: 88 fL (ref 79–97)
Platelets: 232 10*3/uL (ref 150–450)
RBC: 5.34 x10E6/uL (ref 4.14–5.80)
RDW: 12.5 % (ref 11.6–15.4)
WBC: 4.6 10*3/uL (ref 3.4–10.8)

## 2019-12-09 LAB — BASIC METABOLIC PANEL
BUN/Creatinine Ratio: 15 (ref 9–20)
BUN: 18 mg/dL (ref 6–24)
CO2: 26 mmol/L (ref 20–29)
Calcium: 9.8 mg/dL (ref 8.7–10.2)
Chloride: 101 mmol/L (ref 96–106)
Creatinine, Ser: 1.17 mg/dL (ref 0.76–1.27)
GFR calc Af Amer: 84 mL/min/{1.73_m2} (ref >59)
GFR calc non Af Amer: 73 mL/min/{1.73_m2} (ref >59)
Glucose: 94 mg/dL (ref 65–99)
Potassium: 3.9 mmol/L (ref 3.5–5.2)
Sodium: 141 mmol/L (ref 134–144)

## 2019-12-09 LAB — LIPID PANEL WITH LDL/HDL RATIO
Cholesterol, Total: 181 mg/dL (ref 100–199)
HDL: 51 mg/dL (ref >39)
LDL Chol Calc (NIH): 120 mg/dL — ABNORMAL HIGH (ref 0–99)
LDL/HDL Ratio: 2.4 ratio (ref 0.0–3.6)
Triglycerides: 54 mg/dL (ref 0–149)
VLDL Cholesterol Cal: 10 mg/dL (ref 5–40)

## 2019-12-09 LAB — TSH: TSH: 1.31 u[IU]/mL (ref 0.450–4.500)

## 2019-12-11 NOTE — Progress Notes (Signed)
Please inform patient thyroid function, kidney function, electrolytes, and blood counts are normal. However, his LDL (bad cholesterol) is high. His ASCVD score (measures cardiovascular risk) is low, so he does not need medicine at this time. Recommend dietary modifications to improve cholesterol.

## 2020-09-04 IMAGING — CT CT ANGIO CHEST
1 series · 19 of 32 positions shown · IV contrast (APPLIED)
Comparison: Cardiac CT November 15, 2017

CLINICAL DATA: Aortic root prominence on prior cardiac CT

EXAM:
CT ANGIOGRAPHY CHEST WITH CONTRAST
TECHNIQUE: Multidetector CT imaging of the chest was performed using the
standard protocol during bolus administration of intravenous
contrast. Multiplanar CT image reconstructions and MIPs were
obtained to evaluate the vascular anatomy.
CONTRAST:  75mL [id number] IOPAMIDOL ([id number]) INJECTION 76%

[Series 4: chest angio · axial · 0.83mm/px · z∈[-333,+15]mm · 19 of 125 slices shown]
[im 5/125  lung]
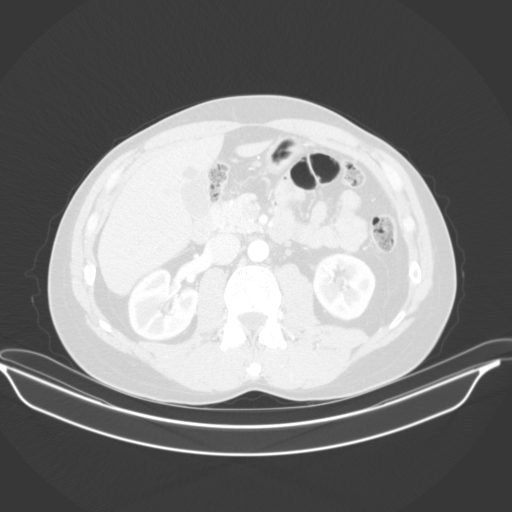
[im 13/125  soft-tissue]
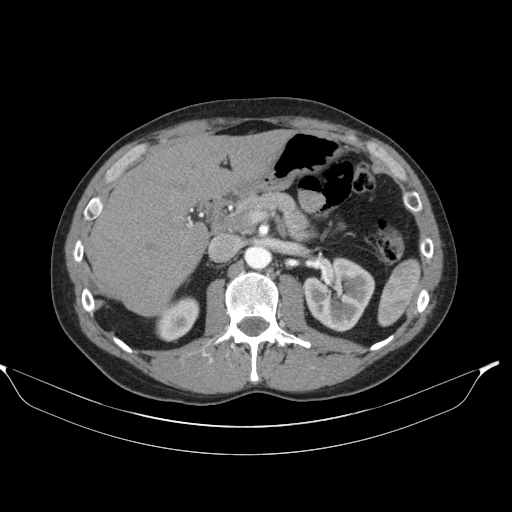
[im 17/125  lung]
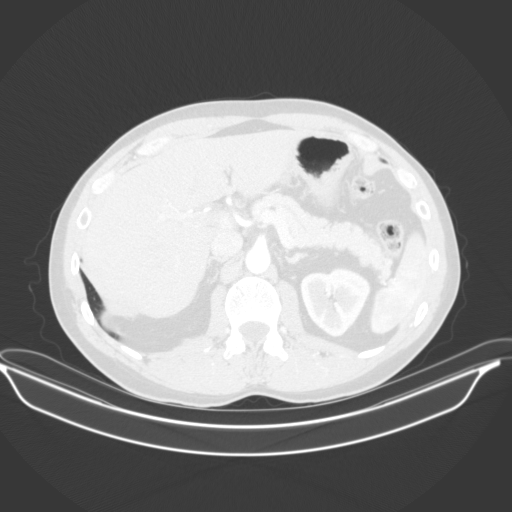
[im 25/125  soft-tissue]
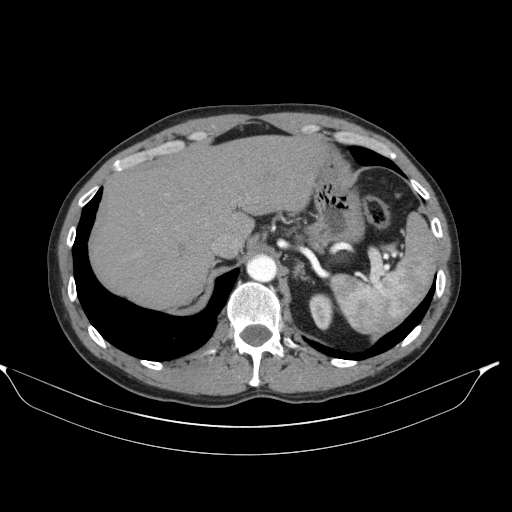
[im 33/125  lung]
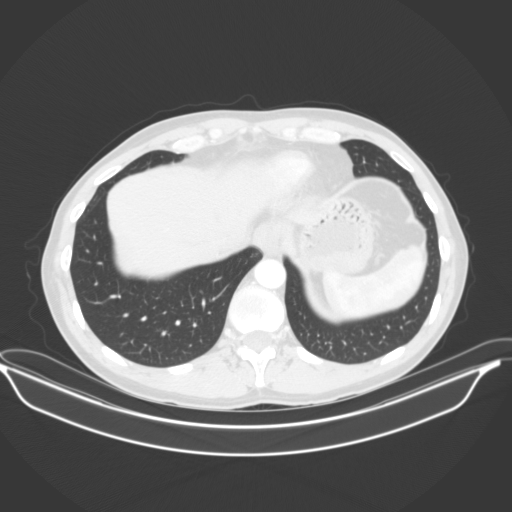
[im 37/125  soft-tissue]
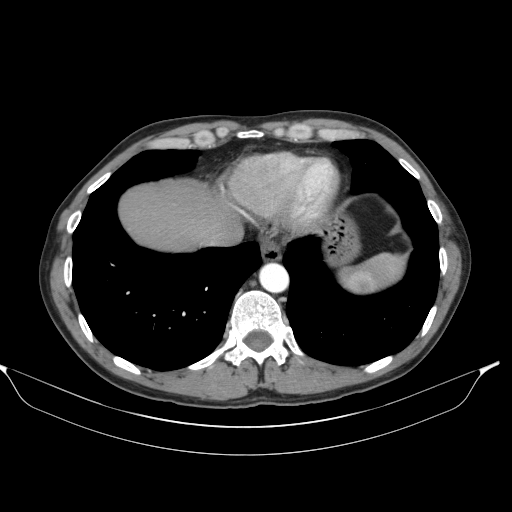
[im 45/125  lung]
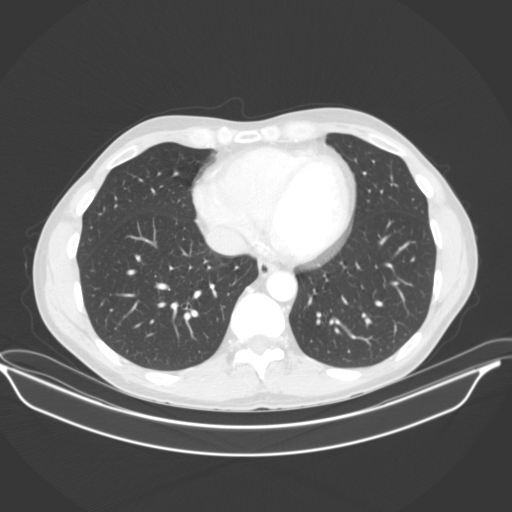
[im 49/125  soft-tissue]
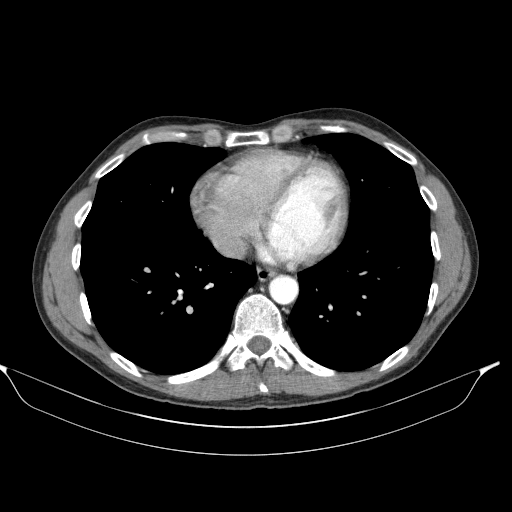
[im 57/125  lung]
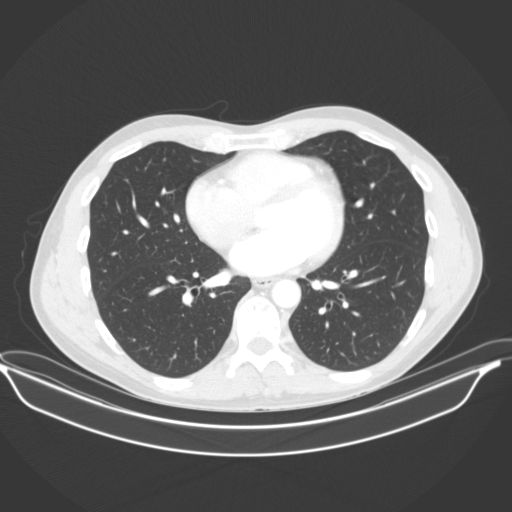
[im 65/125  soft-tissue]
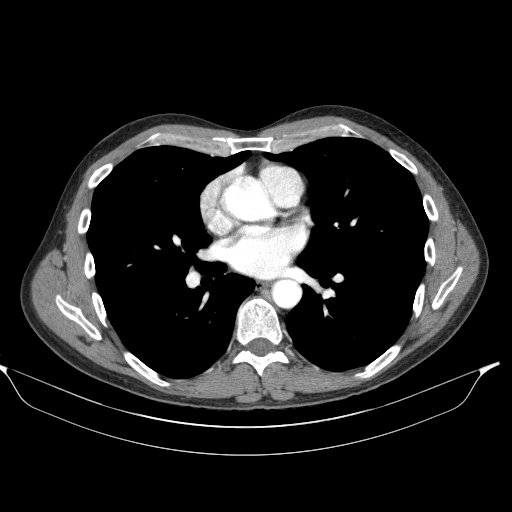
[im 69/125  lung]
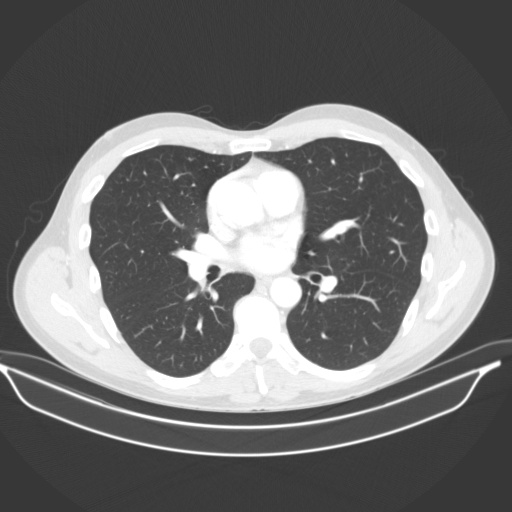
[im 77/125  soft-tissue]
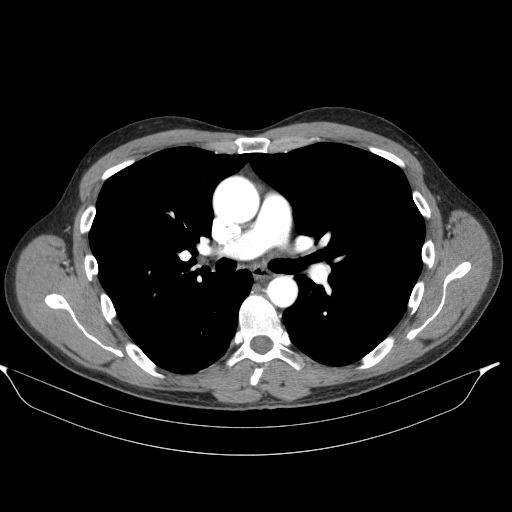
[im 81/125  lung]
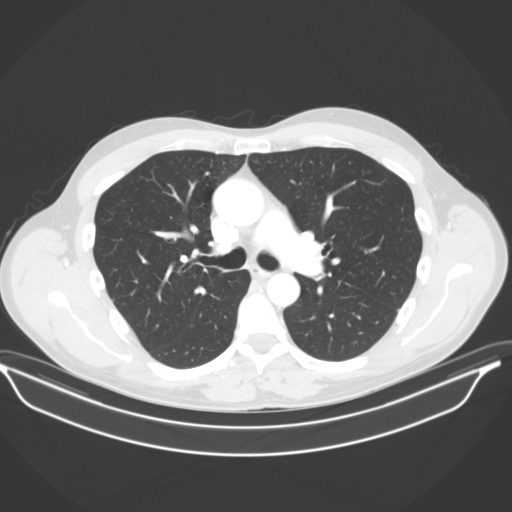
[im 89/125  soft-tissue]
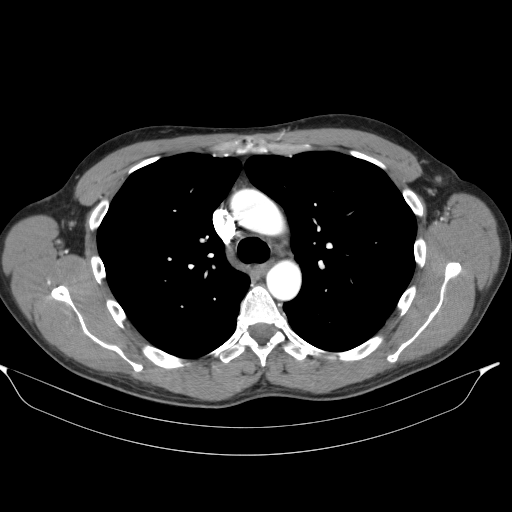
[im 93/125  lung]
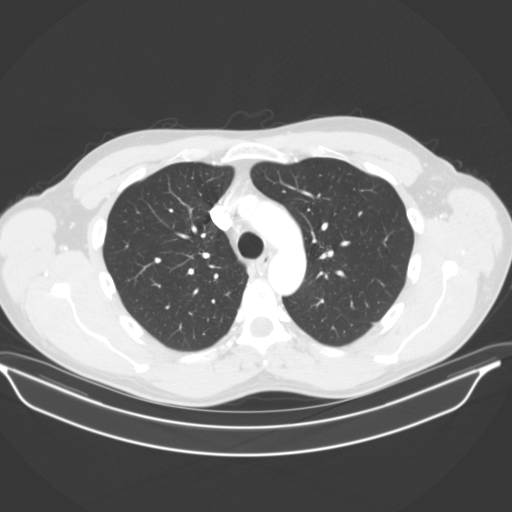
[im 101/125  soft-tissue]
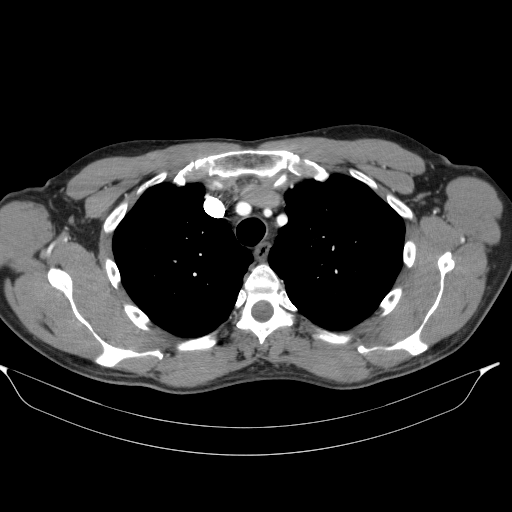
[im 109/125  lung]
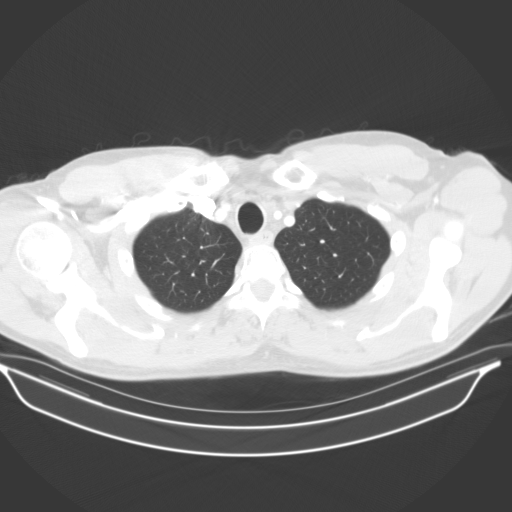
[im 113/125  soft-tissue]
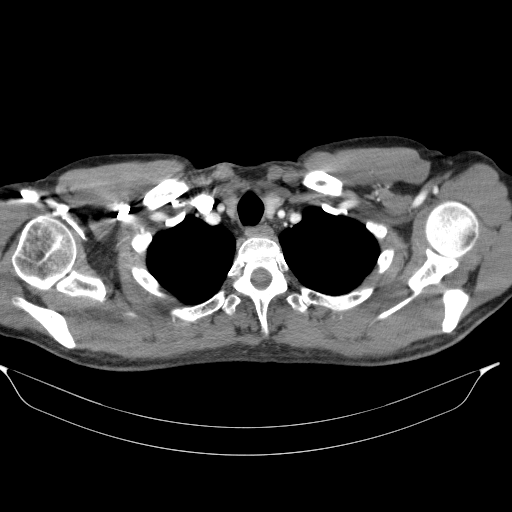
[im 121/125  lung]
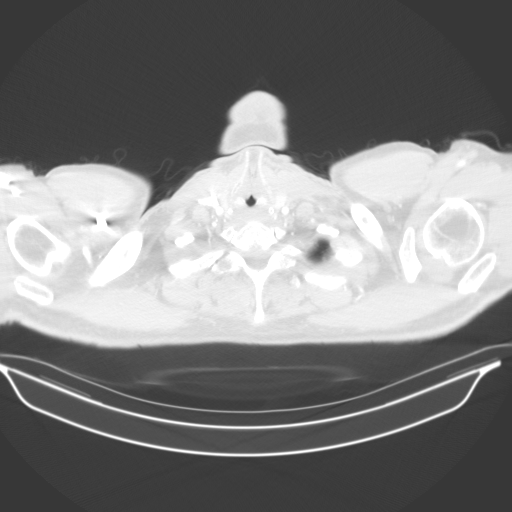

[19 of 32 positions shown; findings below may reference images not displayed]

FINDINGS: Cardiovascular: Ascending thoracic aortic diameter measures 3.9 x
3.9 cm. Measured transverse diameter at the sinuses of Valsalva
measures 3.6 cm. Measured value of the descending thoracic aorta at
the main pulmonary outflow tract level measures 2.6 x 2.5 cm. There
is no evident thoracic aortic dissection. Visualized great vessels
appear unremarkable. No pericardial effusion or pericardial
thickening is evident. There is no demonstrable pulmonary embolus.

Mediastinum/Nodes: Thyroid appears unremarkable. There is no
appreciable thoracic adenopathy. There is a small hiatal hernia.

Lungs/Pleura: There is no edema or consolidation. No pleural
effusions are evident.

Upper Abdomen: Visualized upper abdominal structures appear
unremarkable.

Musculoskeletal: There are no blastic or lytic bone lesions. No
chest wall lesions are evident.

Review of the MIP images confirms the above findings.
IMPRESSION: 1. Ascending thoracic aortic diameter measures 3.9 x 3.9 cm. No
evident thoracic aortic dissection.
2. Small hiatal hernia.
3. No demonstrable pulmonary embolus.
4. Lungs clear.
5. No evident thoracic adenopathy.

## 2020-11-06 ENCOUNTER — Other Ambulatory Visit: Payer: Self-pay

## 2020-11-06 ENCOUNTER — Ambulatory Visit
Admission: RE | Admit: 2020-11-06 | Discharge: 2020-11-06 | Disposition: A | Discharge status: Home / Self Care | Payer: Self-pay | Source: Ambulatory Visit | Attending: Student | Admitting: Student

## 2020-11-06 DIAGNOSIS — I77810 Thoracic aortic ectasia: Secondary | ICD-10-CM

## 2020-11-06 MED ORDER — IOPAMIDOL (ISOVUE-370) INJECTION 76%
75.0000 mL | Freq: Once | INTRAVENOUS | Status: AC | PRN
  Administered 2020-11-06: 75 mL via INTRAVENOUS

## 2020-11-09 NOTE — Progress Notes (Signed)
Mild widening of aorta. Will discuss further at upcoming office visit.

## 2020-11-12 ENCOUNTER — Ambulatory Visit: Payer: Self-pay

## 2020-11-12 ENCOUNTER — Other Ambulatory Visit: Payer: Self-pay

## 2020-11-12 DIAGNOSIS — I493 Ventricular premature depolarization: Secondary | ICD-10-CM

## 2020-11-12 DIAGNOSIS — I77810 Thoracic aortic ectasia: Secondary | ICD-10-CM

## 2020-11-12 NOTE — Progress Notes (Signed)
Called and spoke to to pt, pt voiced understanding.

## 2020-11-14 NOTE — Progress Notes (Signed)
Will discuss further at upcoming [place].

## 2020-11-26 ENCOUNTER — Encounter: Payer: Self-pay | Admitting: Student

## 2020-11-26 ENCOUNTER — Ambulatory Visit: Payer: Self-pay | Admitting: Student

## 2020-11-26 ENCOUNTER — Other Ambulatory Visit: Payer: Self-pay

## 2020-11-26 VITALS — BP 126/98 | HR 72 | Ht 76.0 in | Wt 200.0 lb

## 2020-11-26 DIAGNOSIS — I77810 Thoracic aortic ectasia: Secondary | ICD-10-CM

## 2020-11-26 DIAGNOSIS — I493 Ventricular premature depolarization: Secondary | ICD-10-CM

## 2020-11-26 DIAGNOSIS — E785 Hyperlipidemia, unspecified: Secondary | ICD-10-CM

## 2020-11-26 NOTE — Progress Notes (Signed)
Primary Physician/Referring:  Patient, No Pcp Per (Inactive)  Patient ID: Tyrone [name], male    DOB: 11-22-1970, 50 y.o.   MRN: [id number]  Chief Complaint  Patient presents with   Establish Care   Follow-up   Emesis   Dental Problem   HPI:    Tyrone [name]  is a 50 y.o. Caucasian male  active male with no significant PMH except for near syncope many years ago and probable sinus tachycardia or SVT and PVC on EKG underwent treadmill stress test which revealed ST changes suggestive of ischemia, but excellent exercise tolerance and recommended coronary CTA.  Echocardiogram was essentially unremarkable, test were done in September 2019.  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    Patient presents for annual follow-up and to discuss results of CT angio and echocardiogram.  Following last office visit obtained laboratory evaluation which showed LDL was uncontrolled, however given low ASCVD risk did not recommend statin therapy at that time.  CTA revealed stable mild ectasia of the aorta and echocardiogram demonstrated preserved LVEF and mild LVH.   Patient has been feeling well overall since last office visit.  He has had no recurrence of palpitations.  Denies chest pain, dizziness, syncope, near syncope.  He remains active taking care of his 3 young children.  He does admit to eating out frequently.  Past Medical History:  Diagnosis Date   Anxiety    Aortic root dilatation ([place])    History reviewed. No pertinent surgical history.  Social History   Tobacco Use   Smoking status: Never   Smokeless tobacco: Never  Substance Use Topics   Alcohol use: No  Marital Status: Married  ROS  Review of Systems  Constitutional: Negative for malaise/fatigue and weight gain.  Cardiovascular:  Negative for chest pain, claudication, leg swelling, near-syncope, orthopnea, palpitations, paroxysmal nocturnal dyspnea  and syncope.  Respiratory:  Negative for shortness of breath.   Neurological:  Negative for dizziness.  Objective   Vitals with BMI 11/26/2020 11/26/2020 11/27/2019  Height - 6' 4"  6' 4"   Weight - 200 lbs 199 lbs  BMI - 64.33 29.51  Systolic 884 166 063  Diastolic 98 92 84  Pulse 72 76 76      Physical Exam Vitals reviewed.  Constitutional:      Appearance: He is well-developed.  HENT:     Head: Normocephalic and atraumatic.  Neck:     Thyroid: No thyromegaly.     Vascular: No JVD.  Cardiovascular:     Rate and Rhythm: Normal rate and regular rhythm.     Pulses: Intact distal pulses.     Heart sounds: Normal heart sounds, S1 normal and S2 normal. No murmur heard.   No gallop.     Comments: No leg edema, no JVD. Pulmonary:     Effort: Pulmonary effort is normal. No respiratory distress.     Breath sounds: Normal breath sounds. No wheezing, rhonchi or rales.  Musculoskeletal:        General: Normal range of motion.     Cervical back: Neck supple.     Right lower leg: No edema.     Left lower leg: No edema.  Skin:    General: Skin is warm and dry.  Neurological:     Mental Status: He is alert.   Laboratory examination:    Recent Labs    12/08/19 1003  NA 141  K 3.9  CL 101  CO2 26  GLUCOSE 94  BUN 18  CREATININE 1.17  CALCIUM 9.8  GFRNONAA 73  GFRAA 84   CrCl cannot be calculated (Patient's most recent lab result is older than the maximum 21 days allowed.).  CMP Latest Ref Rng & Units 12/08/2019 02/21/2018 08/03/2006  Glucose 65 - 99 mg/dL 94 93 102(H)  BUN 6 - 24 mg/dL 18 14 19   Creatinine 0.76 - 1.27 mg/dL 1.17 1.11 1.0  Sodium 134 - 144 mmol/L 141 140 134(L)  Potassium 3.5 - 5.2 mmol/L 3.9 3.8 3.5  Chloride 96 - 106 mmol/L 101 101 100  CO2 20 - 29 mmol/L 26 27 30   Calcium 8.7 - 10.2 mg/dL 9.8 9.4 9.6  Total Protein 6.0 - 8.3 g/dL - - 7.1  Total Bilirubin 0.3 - 1.2 mg/dL - - 1.3(H)  Alkaline Phos 39 - 117 units/L - - 38(L)  AST 0 - 37 units/L - -  23  ALT 0 - 53 units/L - - 17   External Labs:   09/30/2017: Creatinine 1.15, EGFR 75/87, potassium 3.8, CMP normal. CBC normal. Cholesterol 186, triglycerides 73, HDL 52, LDL 119. TSH 1.3.   Medications and allergies  No Known Allergies   No current outpatient medications   Radiology:  No results found.   Cardiac Studies:   Echocardiogram 10/11/2017: Left ventricle cavity is normal in size. Mild concentric hypertrophy of the left ventricle. Normal global wall motion. Normal diastolic filling pattern. Calculated EF 56%. No significant valvular abnormality.IVC is dilated with respiratory variation. This is likely normal in a young individual.  Treadmill exercise stress test 10/11/2017 Indication: SOB, Abnormal EKG, palpitations The patient exercised on Bruce protocol for 10:57 min. Patient achieved 13.36 METS and reached HR 160 bpm, which is 92 % of maximum age-predicted HR. Stress test terminated due to fatigue. Resting EKG demonstrates Normal sinus rhythm. Occasional PVC at rest which resolved with exercise and reappeared in recovery. No complex arrhythmias. ST Changes: With peak exercise there was down-sloping ST depression of 2 mm which persisted for >2 minutes into recovery. Chest Pain: none. Stress test terminated due to fatigue. BP Response to Exercise: Normal resting BP- appropriate response. HR Response to Exercise: Appropriate. Exercise capacity was normal.  Recommendations: Consider further cardiac evaluation including coronary CTA, Echocardiogram if clinically indicated.  Coronary CTA angiogram 11/15/2017: 1. Calcium score 0  2.  Normal right dominant coronary arteries with no anomalies  3. Mild aortic root dilatation 4.0 cm compared to descending thoracic aorta 2.8 cm  Recommend annual screening.  CTA chest aorta 11/06/2020: 1. Stable mild ectasia of the ascending thoracic aorta measuring up to 3.9 cm in transverse diameter. 2. No acute findings in the  chest.  Echocardiogram 11/12/2020:  Normal LV systolic function with EF 57%. Left ventricle cavity is normal  in size. Mild concentric hypertrophy of the left ventricle. Normal global  wall motion. Calculated EF 57%.  The aortic root and prox ascending aorta is dilated at 4.1 cm.  No significant valvular abnormality. Estimated right atrial pressure 8  mmHg.   EKG   EKG 11/27/2019: Sinus rhythm at a rate of 80 bpm with frequent PVCs in a trigeminal pattern.  Left atrial enlargement.  Normal axis.  Poor R wave progression, cannot exclude anteroseptal infarct old.  Compared to EKG 11/28/2018, PVCs no frequent trigeminal pattern, LAE new.   Assessment     ICD-10-CM   1. Aortic root dilatation (HCC)  I77.810 EKG 12-Lead    PCV ECHOCARDIOGRAM COMPLETE  2. Mild hyperlipidemia  E78.5     3. PVC (premature ventricular contraction)  I49.3 EKG 12-Lead      No orders of the defined types were placed in this encounter.  Medications Discontinued During This Encounter  Medication Reason   Coenzyme Q10 (CO Q 10 PO) Error   Omega-3 Fatty Acids (OMEGA 3 500 PO) Error    Recommendations:   Tyrone [name]  is a 51 y.o. Caucasian male  active male with PVC on EKG, due to abnormal stress EKG in September 2019, underwent coronary CTA  who has a  aortic root dilatation at 4 cm, recommended annual screening.   Patient presents for annual follow-up and to discuss results of CT angio and echocardiogram.  Following last office visit obtained laboratory evaluation which showed LDL was uncontrolled, however given low ASCVD risk did not recommend statin therapy at that time.  CTA revealed stable mild ectasia of the aorta and echocardiogram demonstrated preserved LVEF with aortic root dilation and mild LVH.  Reviewed and discussed at length with patient regarding results of CTA and echocardiogram.  Patient's mild aortic dilation continues to remain stable, still questionable whether this is aneurysmal or  just normal variant.  Patient does have anxieties regarding monitoring this, therefore shared decision was to repeat echocardiogram in 1 year.  Patient is otherwise stable from a cardiovascular standpoint.  Patient's blood pressure is mildly elevated, however he wishes not to initiate losartan at this time and will instead focus on reducing sodium intake and weight loss.  No changes are made at today's office visit.  Patient's physical exam and EKG remained stable.  Follow-up in 1 year after repeat echocardiogram.   Alethia Berthold, PA-C 11/26/2020, 12:54 PM Office: [phone]

## 2021-11-26 ENCOUNTER — Ambulatory Visit: Payer: Self-pay | Admitting: Student

## 2023-10-11 ENCOUNTER — Other Ambulatory Visit: Payer: Self-pay

## 2023-10-11 ENCOUNTER — Telehealth: Payer: Self-pay | Admitting: Orthopedic Surgery

## 2023-10-11 ENCOUNTER — Ambulatory Visit: Payer: Self-pay | Admitting: Orthopedic Surgery

## 2023-10-11 DIAGNOSIS — M79671 Pain in right foot: Secondary | ICD-10-CM

## 2023-10-11 NOTE — Telephone Encounter (Signed)
 Pt called wanting to know schd for his exercises that he Dr gave him today,.  Pt also wants to know about the sole insert for his shoesPt call back number is [phone].

## 2023-10-12 ENCOUNTER — Encounter: Payer: Self-pay | Admitting: Orthopedic Surgery

## 2023-10-12 NOTE — Progress Notes (Signed)
 Office Visit Note   Patient: Tyrone [name]           Date of Birth: [birth year]           MRN: [id number] Visit Date: 10/11/2023              Requested by: No referring provider defined for this encounter. PCP: Patient, No Pcp Per  Chief Complaint  Patient presents with   Right Foot - Pain      HPI: Discussed the use of AI scribe software for clinical note transcription with the patient, who gave verbal consent to proceed.  History of Present Illness Tyrone [name] is a 53 year old male who presents with foot pain. He was referred by Dr. Lavada for evaluation of a bone spur.  He has been experiencing foot pain primarily located in the plantar fascia area for the past two years. The pain was initially managed with prednisone, which provided temporary relief, but it recurred, especially with increased activity. The pain worsens at bedtime and during the night, often radiating into his calf, described as feeling like 'a rod's going at my foot.' He walks with a limp to protect the foot, leading to soreness in his leg.  The pain is less in the mornings but increases with activity throughout the day. Sitting down relieves the pressure, but standing up again causes significant pain. He has tried using gel inserts to alleviate pressure, but they have not been effective. He has been using supportive footwear at home, but the pain persists.  He mentions a small bump on the lateral aspect of his heel, which is not painful unless pressed. He is unsure if it is a callus or something else, as it does not appear on x-rays.  He has three young children, which requires him to be active, exacerbating the pain.     Assessment & Plan: Visit Diagnoses:  1. Pain in right foot     Plan: Assessment and Plan Assessment & Plan Right plantar fasciitis Chronic plantar fasciitis with Achilles tendon involvement causing compensatory gait changes and flat foot. Bone spur is a normal variant.  Achilles tightness is primary pain source. - Recommend stiff sneakers to prevent plantar fascia stretching. - Advise Sol orthotics with arch support for daily use. - Instruct on Achilles tendon stretching exercises 3-4 times daily for 1 minute each. - Prescribe Voltaren gel for anti-inflammatory use on painful areas. - Encourage fascial strengthening exercises.  Right heel callus versus adipose scar tissue Small bump on lateral right heel, likely callus or adipose scar tissue. Not bone-related, no infection or nodules. - Monitor for changes in size, color, or tenderness. - Consider MRI if changes occur.      Follow-Up Instructions: No follow-ups on file.   Ortho Exam  Patient is alert, oriented, no adenopathy, well-dressed, normal affect, normal respiratory effort. Physical Exam EXTREMITIES: Good pulses with knee extended. Dorsiflexion to neutral. Point tenderness to palpation of medial plantar fascia. Callus on lateral aspect of heel. No redness or cellulitis. No nodules in Achilles tendon to palpation. Good ankle and subtalar range of motion.      Imaging: No results found. No images are attached to the encounter.  Labs: Lab Results  Component Value Date   ESRSEDRATE 5 08/03/2006     Lab Results  Component Value Date   ALBUMIN 4.1 08/03/2006    No results found for: MG No results found for: VD25OH  No results found for: PREALBUMIN  Latest Ref Rng & Units 12/08/2019   10:03 AM 08/03/2006   11:25 AM  CBC EXTENDED  WBC 3.4 - 10.8 x10E3/uL 4.6  5.2   RBC 4.14 - 5.80 x10E6/uL 5.34  4.92   Hemoglobin 13.0 - 17.7 g/dL 83.7  84.4   HCT 62.4 - 51.0 % 46.8  43.7   Platelets 150 - 450 x10E3/uL 232  210   NEUT# 1.4 - 7.7 K/uL  3.6      There is no height or weight on file to calculate BMI.  Orders:  Orders Placed This Encounter  Procedures   XR Foot 2 Views Right   No orders of the defined types were placed in this encounter.    Procedures: No  procedures performed  Clinical Data: No additional findings.  ROS:  All other systems negative, except as noted in the HPI. Review of Systems  Objective: Vital Signs: There were no vitals taken for this visit.  Specialty Comments:  No specialty comments available.  PMFS History: Patient Active Problem List   Diagnosis Date Noted   Elevated blood pressure affecting pregnancy in first trimester, antepartum 11/28/2018   Anxiety 11/28/2018   Anxiety state 03/03/2007   PANIC ATTACK 03/03/2007   IRRITABLE BOWEL SYNDROME 03/03/2007   Esophagitis 11/21/2004   ESOPHAGEAL REFLUX 11/21/2004   Diaphragmatic hernia 11/21/2004   Past Medical History:  Diagnosis Date   Anxiety    Aortic root dilatation     History reviewed. No pertinent family history.  History reviewed. No pertinent surgical history. Social History   Occupational History   Not on file  Tobacco Use   Smoking status: Never   Smokeless tobacco: Never  Vaping Use   Vaping status: Never Used  Substance and Sexual Activity   Alcohol use: No   Drug use: No   Sexual activity: Not on file

## 2023-10-13 NOTE — Telephone Encounter (Signed)
 Called and lm on vm to advise that he should use Sol orthotics and achilles stretching  several times a day 1 min each per Dr. Crist dictated note and to call with any questions.

## 2023-11-15 ENCOUNTER — Encounter: Payer: Self-pay | Admitting: Radiology
